# Patient Record
Sex: Female | Born: 1950 | Race: White | Hispanic: No | Smoking: Former smoker
Health system: Southern US, Community
[De-identification: ages and names within clinical notes are randomized; demographics above are authoritative.]

## PROBLEM LIST (undated history)

## (undated) DIAGNOSIS — F419 Anxiety disorder, unspecified: Secondary | ICD-10-CM

## (undated) DIAGNOSIS — F32A Depression, unspecified: Secondary | ICD-10-CM

## (undated) DIAGNOSIS — H9192 Unspecified hearing loss, left ear: Secondary | ICD-10-CM

## (undated) DIAGNOSIS — I10 Essential (primary) hypertension: Secondary | ICD-10-CM

## (undated) DIAGNOSIS — J310 Chronic rhinitis: Secondary | ICD-10-CM

## (undated) DIAGNOSIS — R7303 Prediabetes: Secondary | ICD-10-CM

## (undated) DIAGNOSIS — E785 Hyperlipidemia, unspecified: Secondary | ICD-10-CM

## (undated) DIAGNOSIS — E7800 Pure hypercholesterolemia, unspecified: Secondary | ICD-10-CM

## (undated) HISTORY — PX: BREAST EXCISIONAL BIOPSY: SUR124

## (undated) HISTORY — PX: COLONOSCOPY: SHX174

---

## 1994-10-09 HISTORY — PX: BREAST BIOPSY: SHX20

## 1997-10-09 HISTORY — PX: BREAST BIOPSY: SHX20

## 1999-10-10 HISTORY — PX: BREAST CYST ASPIRATION: SHX578

## 2005-03-21 ENCOUNTER — Ambulatory Visit: Payer: Self-pay | Admitting: Internal Medicine

## 2006-04-17 ENCOUNTER — Ambulatory Visit: Payer: Self-pay | Admitting: Internal Medicine

## 2007-04-22 ENCOUNTER — Ambulatory Visit: Payer: Self-pay | Admitting: Internal Medicine

## 2008-04-22 ENCOUNTER — Ambulatory Visit: Payer: Self-pay | Admitting: Internal Medicine

## 2008-07-06 ENCOUNTER — Ambulatory Visit: Payer: Self-pay | Admitting: Unknown Physician Specialty

## 2009-04-23 ENCOUNTER — Ambulatory Visit: Payer: Self-pay | Admitting: Internal Medicine

## 2010-05-05 ENCOUNTER — Ambulatory Visit: Payer: Self-pay | Admitting: Internal Medicine

## 2011-05-08 ENCOUNTER — Ambulatory Visit: Payer: Self-pay | Admitting: Internal Medicine

## 2011-11-10 ENCOUNTER — Ambulatory Visit: Payer: Self-pay | Admitting: Family Medicine

## 2012-05-28 ENCOUNTER — Ambulatory Visit: Payer: Self-pay | Admitting: Family Medicine

## 2013-07-08 ENCOUNTER — Ambulatory Visit: Payer: Self-pay | Admitting: Family Medicine

## 2013-10-15 ENCOUNTER — Ambulatory Visit: Payer: BC Managed Care – PPO | Admitting: Internal Medicine

## 2014-07-31 ENCOUNTER — Ambulatory Visit: Payer: Self-pay | Admitting: Family Medicine

## 2015-07-05 ENCOUNTER — Other Ambulatory Visit: Payer: Self-pay | Admitting: Family Medicine

## 2015-07-05 DIAGNOSIS — Z1231 Encounter for screening mammogram for malignant neoplasm of breast: Secondary | ICD-10-CM

## 2015-08-02 ENCOUNTER — Ambulatory Visit
Admission: RE | Admit: 2015-08-02 | Discharge: 2015-08-02 | Disposition: A | Discharge status: Home / Self Care | Payer: BLUE CROSS/BLUE SHIELD | Source: Ambulatory Visit | Attending: Family Medicine | Admitting: Family Medicine

## 2015-08-02 DIAGNOSIS — Z1231 Encounter for screening mammogram for malignant neoplasm of breast: Secondary | ICD-10-CM | POA: Insufficient documentation

## 2016-07-13 ENCOUNTER — Other Ambulatory Visit: Payer: Self-pay | Admitting: Family Medicine

## 2016-07-13 DIAGNOSIS — Z1231 Encounter for screening mammogram for malignant neoplasm of breast: Secondary | ICD-10-CM

## 2016-08-09 ENCOUNTER — Ambulatory Visit
Admission: RE | Admit: 2016-08-09 | Discharge: 2016-08-09 | Disposition: A | Discharge status: Home / Self Care | Payer: Managed Care, Other (non HMO) | Source: Ambulatory Visit | Attending: Family Medicine | Admitting: Family Medicine

## 2016-08-09 DIAGNOSIS — Z1231 Encounter for screening mammogram for malignant neoplasm of breast: Secondary | ICD-10-CM | POA: Diagnosis present

## 2016-10-10 IMAGING — MG MM DIGITAL SCREENING BILAT W/ CAD
6 series · 6 of 6 positions shown · non-contrast
Comparison: Previous exam(s).

CLINICAL DATA: Screening.

EXAM:
DIGITAL SCREENING BILATERAL MAMMOGRAM WITH CAD

[L MLO]
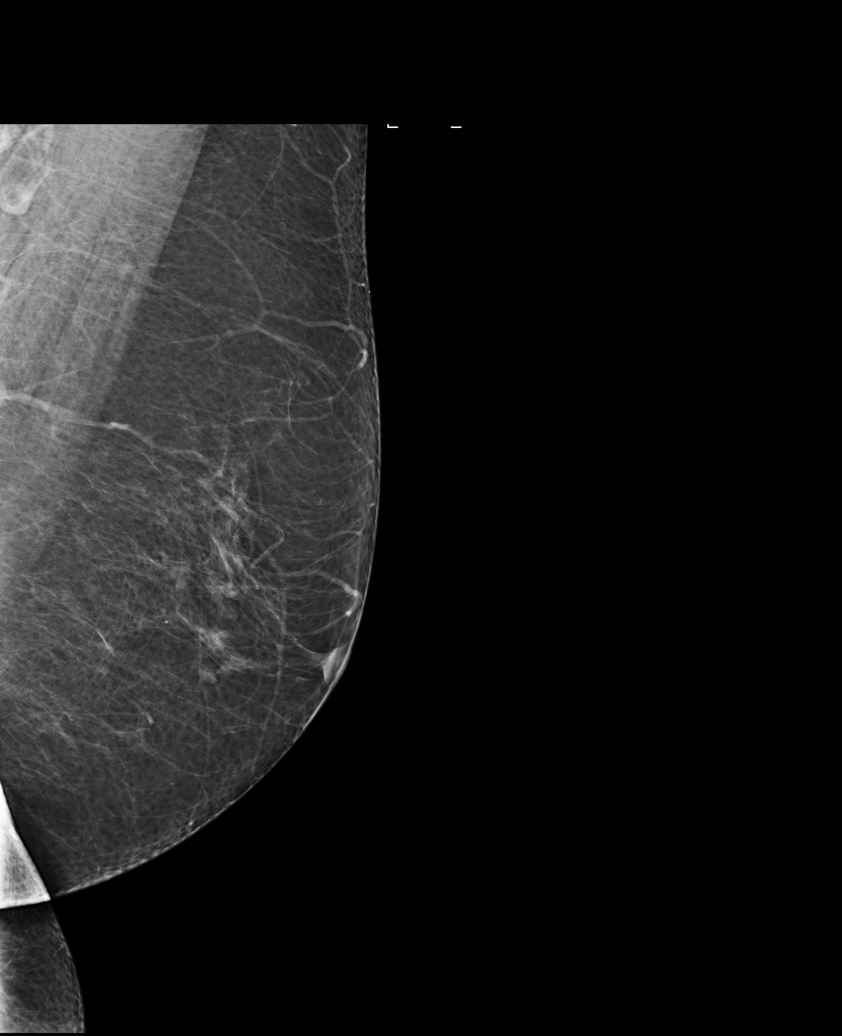

[R CC]
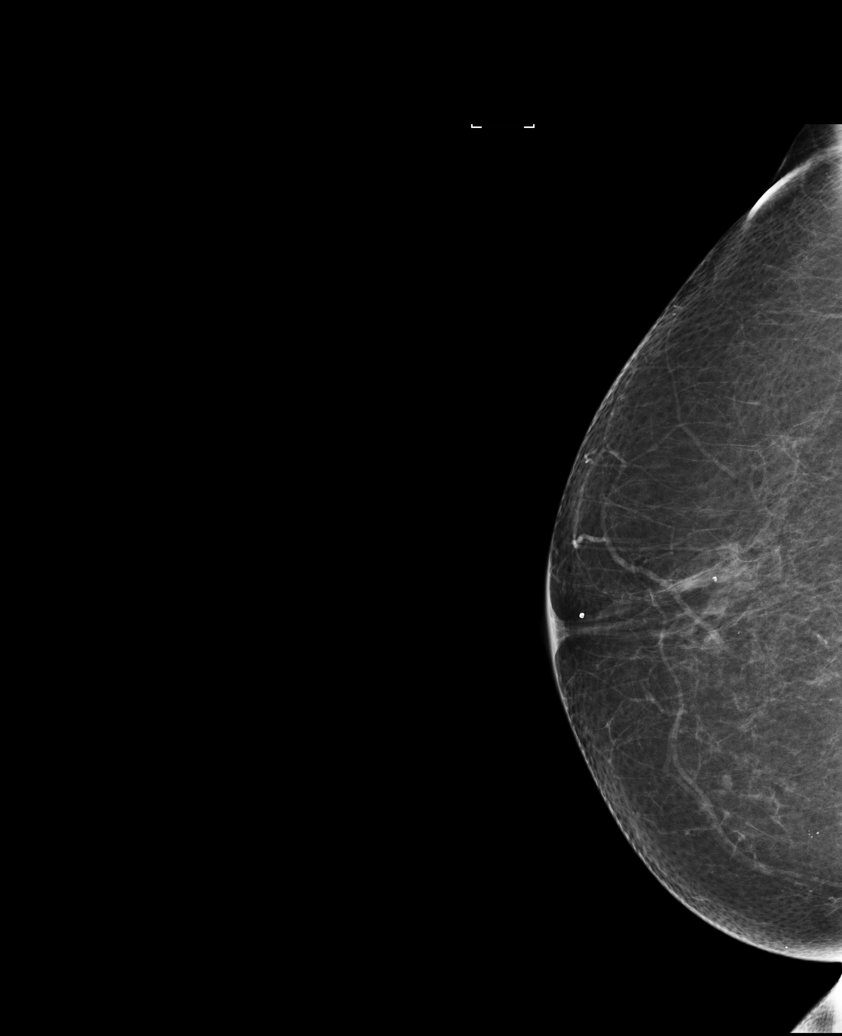

[R MLO (1 of 2)]
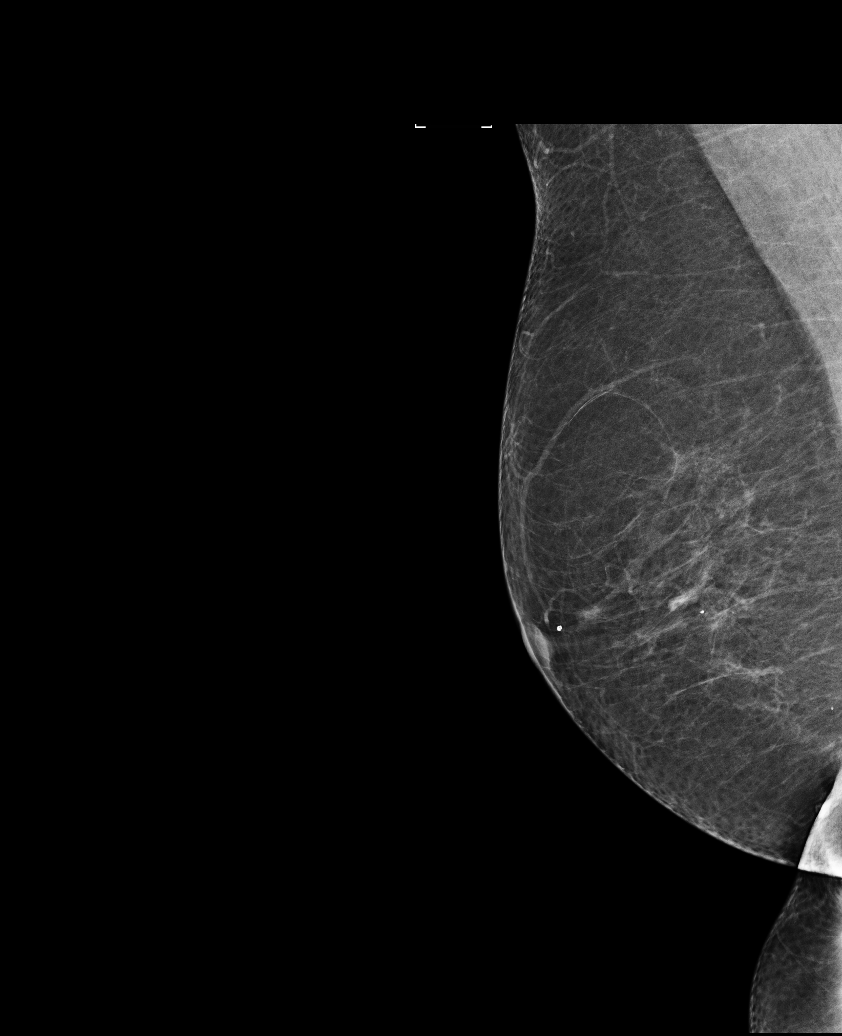

[L CC]
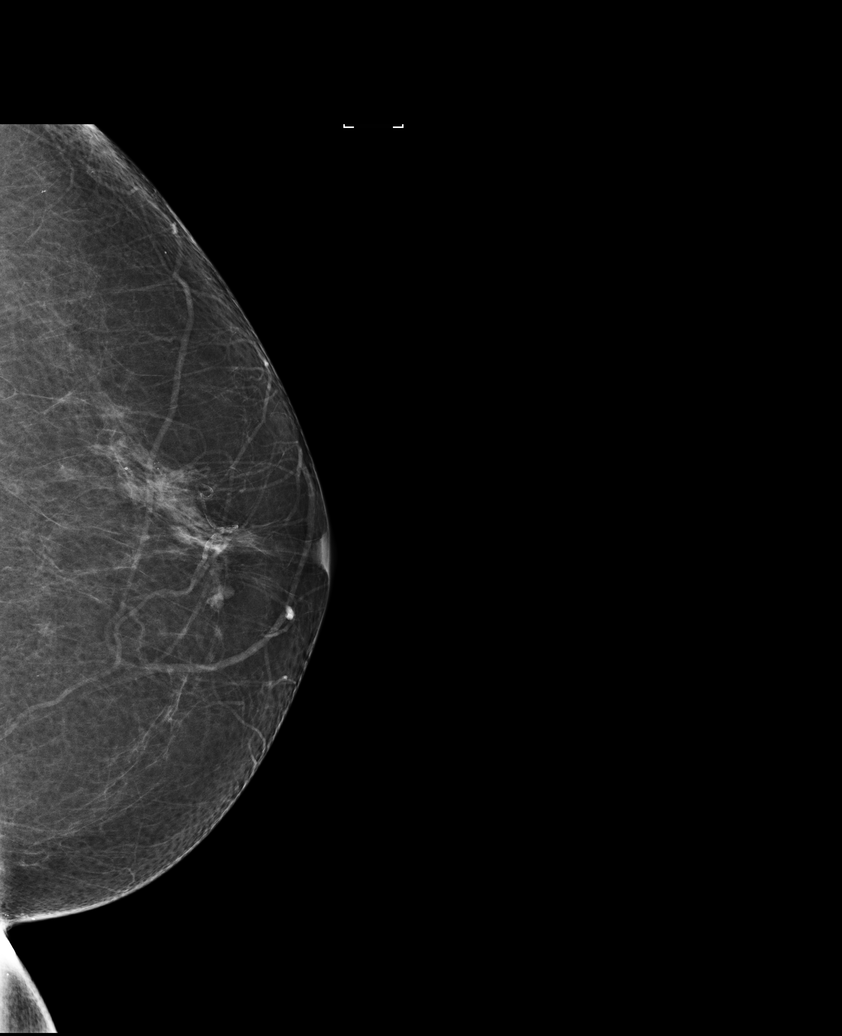

[R MLO (2 of 2)]
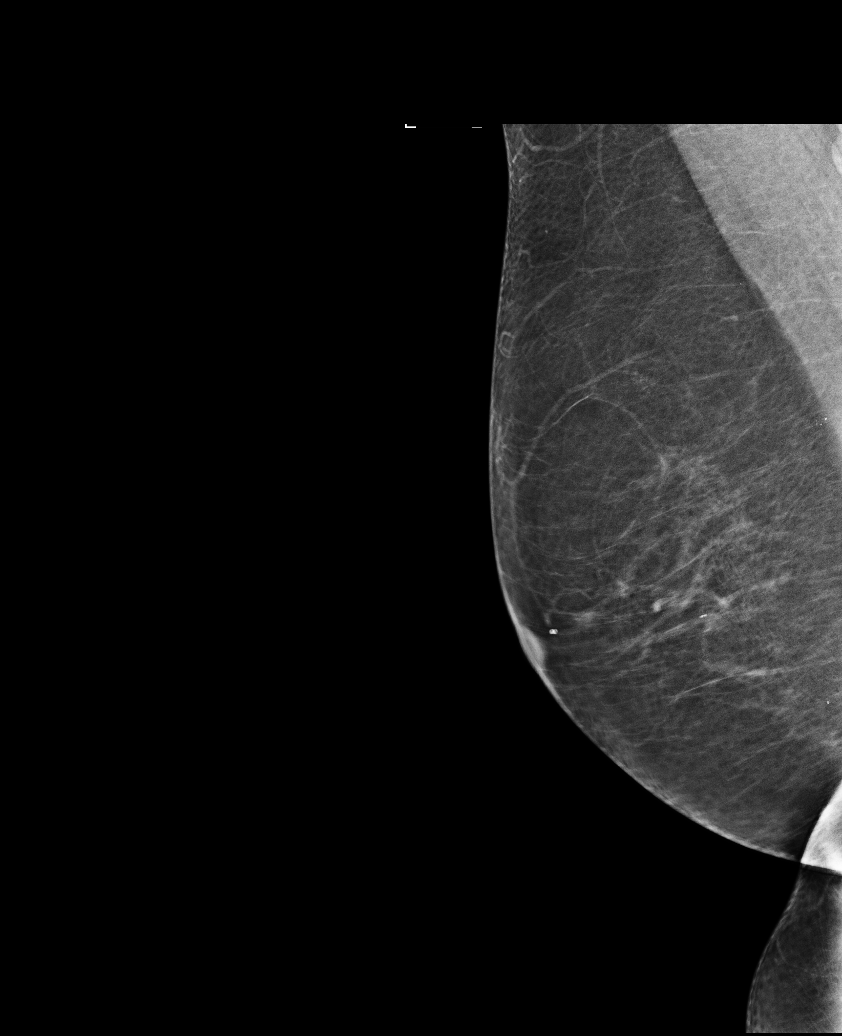

[R XCCM]
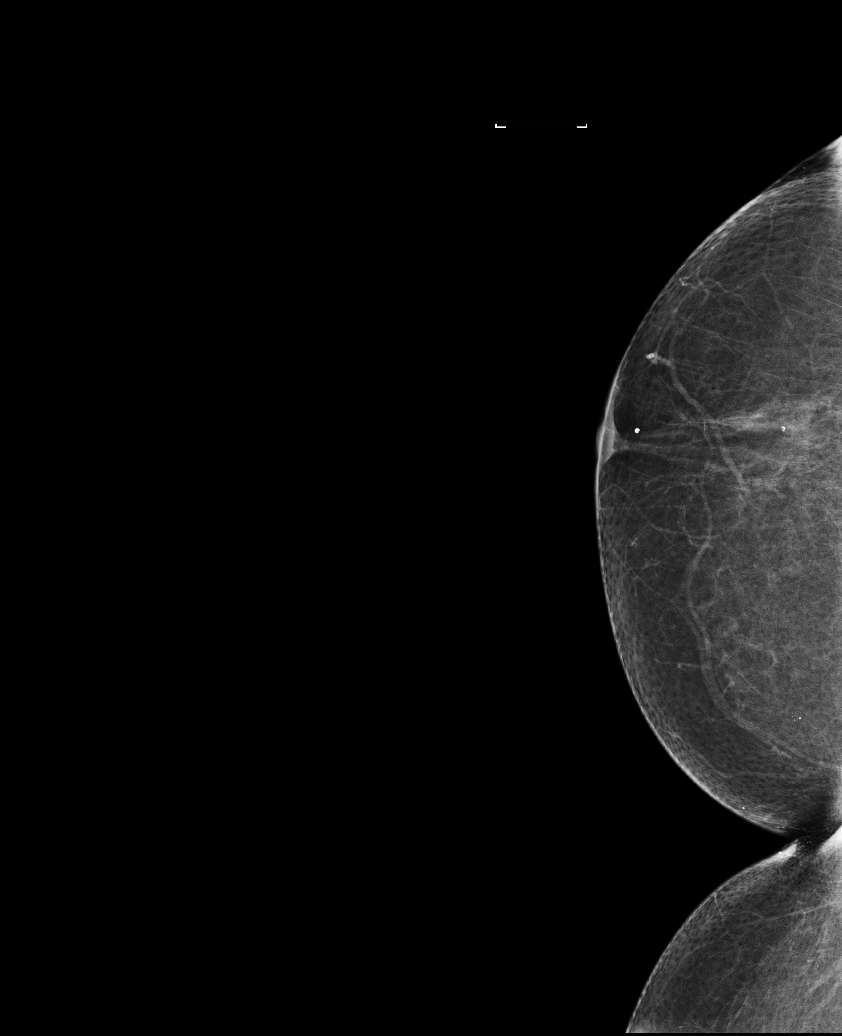

[6 of 6 positions shown; findings below may reference images not displayed]

ACR Breast Density Category b: There are scattered areas of
fibroglandular density.
FINDINGS: There are no findings suspicious for malignancy. Images were
processed with CAD.
IMPRESSION: No mammographic evidence of malignancy. A result letter of this
screening mammogram will be mailed directly to the patient.

RECOMMENDATION:
Screening mammogram in one year. (Code:AS-G-LCT)

BI-RADS CATEGORY  1: Negative.

## 2017-12-21 ENCOUNTER — Other Ambulatory Visit: Payer: Self-pay | Admitting: Family Medicine

## 2017-12-21 DIAGNOSIS — Z1231 Encounter for screening mammogram for malignant neoplasm of breast: Secondary | ICD-10-CM

## 2018-01-11 ENCOUNTER — Ambulatory Visit
Admission: RE | Admit: 2018-01-11 | Discharge: 2018-01-11 | Disposition: A | Discharge status: Home / Self Care | Payer: Managed Care, Other (non HMO) | Source: Ambulatory Visit | Attending: Family Medicine | Admitting: Family Medicine

## 2018-01-11 DIAGNOSIS — Z1231 Encounter for screening mammogram for malignant neoplasm of breast: Secondary | ICD-10-CM | POA: Insufficient documentation

## 2019-07-31 ENCOUNTER — Other Ambulatory Visit: Payer: Self-pay | Admitting: Family Medicine

## 2019-07-31 DIAGNOSIS — Z1231 Encounter for screening mammogram for malignant neoplasm of breast: Secondary | ICD-10-CM

## 2019-10-21 ENCOUNTER — Ambulatory Visit
Admission: RE | Admit: 2019-10-21 | Discharge: 2019-10-21 | Disposition: A | Discharge status: Home / Self Care | Payer: 59 | Source: Ambulatory Visit | Attending: Family Medicine | Admitting: Family Medicine

## 2019-10-21 DIAGNOSIS — Z1231 Encounter for screening mammogram for malignant neoplasm of breast: Secondary | ICD-10-CM | POA: Diagnosis present

## 2019-10-23 ENCOUNTER — Other Ambulatory Visit: Payer: Self-pay | Admitting: Family Medicine

## 2019-10-23 DIAGNOSIS — R928 Other abnormal and inconclusive findings on diagnostic imaging of breast: Secondary | ICD-10-CM

## 2019-10-23 DIAGNOSIS — N6320 Unspecified lump in the left breast, unspecified quadrant: Secondary | ICD-10-CM

## 2019-10-30 ENCOUNTER — Ambulatory Visit
Admission: RE | Admit: 2019-10-30 | Discharge: 2019-10-30 | Disposition: A | Discharge status: Home / Self Care | Payer: 59 | Source: Ambulatory Visit | Attending: Family Medicine | Admitting: Family Medicine

## 2019-10-30 DIAGNOSIS — R928 Other abnormal and inconclusive findings on diagnostic imaging of breast: Secondary | ICD-10-CM | POA: Diagnosis present

## 2019-10-30 DIAGNOSIS — N6320 Unspecified lump in the left breast, unspecified quadrant: Secondary | ICD-10-CM | POA: Insufficient documentation

## 2021-05-10 ENCOUNTER — Other Ambulatory Visit: Payer: Self-pay | Admitting: Family Medicine

## 2021-05-10 DIAGNOSIS — Z1231 Encounter for screening mammogram for malignant neoplasm of breast: Secondary | ICD-10-CM

## 2021-05-18 ENCOUNTER — Other Ambulatory Visit: Payer: Self-pay

## 2021-05-18 ENCOUNTER — Ambulatory Visit
Admission: RE | Admit: 2021-05-18 | Discharge: 2021-05-18 | Disposition: A | Discharge status: Home / Self Care | Payer: 59 | Source: Ambulatory Visit | Attending: Family Medicine | Admitting: Family Medicine

## 2021-05-18 DIAGNOSIS — Z1231 Encounter for screening mammogram for malignant neoplasm of breast: Secondary | ICD-10-CM | POA: Insufficient documentation

## 2021-05-25 ENCOUNTER — Other Ambulatory Visit: Payer: Self-pay | Admitting: Family Medicine

## 2021-05-25 DIAGNOSIS — N6320 Unspecified lump in the left breast, unspecified quadrant: Secondary | ICD-10-CM

## 2021-05-25 DIAGNOSIS — N6489 Other specified disorders of breast: Secondary | ICD-10-CM

## 2021-05-25 DIAGNOSIS — R928 Other abnormal and inconclusive findings on diagnostic imaging of breast: Secondary | ICD-10-CM

## 2021-06-01 ENCOUNTER — Ambulatory Visit
Admission: RE | Admit: 2021-06-01 | Discharge: 2021-06-01 | Disposition: A | Discharge status: Home / Self Care | Payer: 59 | Source: Ambulatory Visit | Attending: Family Medicine | Admitting: Family Medicine

## 2021-06-01 ENCOUNTER — Other Ambulatory Visit: Payer: Self-pay

## 2021-06-01 DIAGNOSIS — R928 Other abnormal and inconclusive findings on diagnostic imaging of breast: Secondary | ICD-10-CM | POA: Diagnosis not present

## 2021-06-01 DIAGNOSIS — N6320 Unspecified lump in the left breast, unspecified quadrant: Secondary | ICD-10-CM

## 2021-06-01 DIAGNOSIS — N6489 Other specified disorders of breast: Secondary | ICD-10-CM

## 2021-10-20 ENCOUNTER — Encounter: Payer: Self-pay | Admitting: Gastroenterology

## 2021-10-20 NOTE — H&P (Signed)
Pre-Procedure H&P   Patient ID: Cheryl Wilkinson is a 71 y.o. female.  Gastroenterology Provider: Annamaria Helling, DO  Referring Provider: Dr. Netty Starring PCP: Dion Body, MD  Date: 10/21/2021  HPI Ms. Cheryl Wilkinson is a 71 y.o. female who presents today for Colonoscopy for screening colonoscopy.  Patient's last colonoscopy 06/2008 which was noted to have one hyperplastic polyp and IH. Colon was noted to be long and tortuous. No fhx of crc or polyps Daily bm w/o blood melena diarrhea or constipation. Hgb 14, mcv 94 plt 297 cr 0.8 No other acute gi complaints  Past Medical History:  Diagnosis Date   Anxiety    Borderline diabetes mellitus    Chronic rhinitis    Depression    Hearing loss in left ear    Hypercholesteremia    Hyperlipemia    Hypertension    Morbid obesity ([place])     Past Surgical History:  Procedure Laterality Date   BREAST BIOPSY Left 1999   neg   BREAST CYST ASPIRATION Right 2001   cyst aspiration, benign   BREAST EXCISIONAL BIOPSY Left    1990's   COLONOSCOPY      Family History No h/o GI disease or malignancy  Review of Systems  Constitutional:  Negative for activity change, appetite change, chills, diaphoresis, fatigue, fever and unexpected weight change.  HENT:  Negative for trouble swallowing and voice change.   Respiratory:  Negative for shortness of breath and wheezing.   Cardiovascular:  Negative for chest pain, palpitations and leg swelling.  Gastrointestinal:  Negative for abdominal distention, abdominal pain, anal bleeding, blood in stool, constipation, diarrhea, nausea, rectal pain and vomiting.  Musculoskeletal:  Negative for arthralgias and myalgias.  Skin:  Negative for color change and pallor.  Neurological:  Negative for dizziness, syncope and weakness.  Psychiatric/Behavioral:  Negative for confusion.   All other systems reviewed and are negative.   Medications No current facility-administered medications on  file prior to encounter.   Current Outpatient Medications on File Prior to Encounter  Medication Sig Dispense Refill   acetaminophen (TYLENOL) 500 MG tablet Take 500 mg by mouth every 6 (six) hours as needed.     atorvastatin (LIPITOR) 20 MG tablet Take 20 mg by mouth daily.     cetirizine (ZYRTEC) 10 MG tablet Take 10 mg by mouth daily.     fluticasone (FLONASE) 50 MCG/ACT nasal spray Place into both nostrils daily.     losartan (COZAAR) 25 MG tablet Take 25 mg by mouth daily.      Pertinent medications related to GI and procedure were reviewed by me with the patient prior to the procedure   Current Facility-Administered Medications:    0.9 %  sodium chloride infusion, , Intravenous, Continuous, Annamaria Helling, DO      Not on File Allergies were reviewed by me prior to the procedure  Objective    Vitals:   10/21/21 0838  BP: (!) 167/99  Pulse: 82  Resp: 16  Temp: (!) 97.1 F (36.2 C)  TempSrc: Temporal  SpO2: 100%  Weight: 103.9 kg  Height: 5\' 6"  (1.676 m)     Physical Exam Vitals and nursing note reviewed.  Constitutional:      General: She is not in acute distress.    Appearance: Normal appearance. She is obese. She is not ill-appearing, toxic-appearing or diaphoretic.  HENT:     Head: Normocephalic and atraumatic.     Nose: Nose normal.  Mouth/Throat:     Mouth: Mucous membranes are moist.     Pharynx: Oropharynx is clear.  Eyes:     General: No scleral icterus.    Extraocular Movements: Extraocular movements intact.  Cardiovascular:     Rate and Rhythm: Normal rate.     Heart sounds: Normal heart sounds. No murmur heard.   No friction rub. No gallop.  Pulmonary:     Effort: Pulmonary effort is normal. No respiratory distress.     Breath sounds: Normal breath sounds. No wheezing, rhonchi or rales.  Abdominal:     General: Bowel sounds are normal. There is no distension.     Palpations: Abdomen is soft.     Tenderness: There is no abdominal  tenderness. There is no guarding or rebound.  Musculoskeletal:     Cervical back: Neck supple.     Right lower leg: No edema.     Left lower leg: No edema.  Skin:    General: Skin is warm and dry.     Coloration: Skin is not jaundiced or pale.  Neurological:     General: No focal deficit present.     Mental Status: She is alert and oriented to person, place, and time. Mental status is at baseline.  Psychiatric:        Mood and Affect: Mood normal.        Behavior: Behavior normal.        Thought Content: Thought content normal.        Judgment: Judgment normal.     Assessment:  Ms. Cheryl Wilkinson is a 71 y.o. female  who presents today for Colonoscopy for screening colonoscopy.  Plan:  Colonoscopy with possible intervention today  Colonoscopy with possible biopsy, control of bleeding, polypectomy, and interventions as necessary has been discussed with the patient/patient representative. Informed consent was obtained from the patient/patient representative after explaining the indication, nature, and risks of the procedure including but not limited to death, bleeding, perforation, missed neoplasm/lesions, cardiorespiratory compromise, and reaction to medications. Opportunity for questions was given and appropriate answers were provided. Patient/patient representative has verbalized understanding is amenable to undergoing the procedure.   Annamaria Helling, DO  [facility]  Portions of the record may have been created with voice recognition software. Occasional wrong-word or 'sound-a-like' substitutions may have occurred due to the inherent limitations of voice recognition software.  Read the chart carefully and recognize, using context, where substitutions may have occurred.

## 2021-10-21 ENCOUNTER — Other Ambulatory Visit: Payer: Self-pay

## 2021-10-21 ENCOUNTER — Ambulatory Visit: Payer: BLUE CROSS/BLUE SHIELD | Admitting: Anesthesiology

## 2021-10-21 ENCOUNTER — Encounter: Payer: Self-pay | Admitting: Gastroenterology

## 2021-10-21 ENCOUNTER — Encounter
Admission: RE | Disposition: A | Discharge status: Home / Self Care | Payer: Self-pay | Source: Home / Self Care | Attending: Gastroenterology

## 2021-10-21 ENCOUNTER — Ambulatory Visit
Admission: RE | Admit: 2021-10-21 | Discharge: 2021-10-21 | Disposition: A | Discharge status: Home / Self Care | Payer: BLUE CROSS/BLUE SHIELD | Attending: Gastroenterology | Admitting: Gastroenterology

## 2021-10-21 DIAGNOSIS — K641 Second degree hemorrhoids: Secondary | ICD-10-CM | POA: Insufficient documentation

## 2021-10-21 DIAGNOSIS — Z1211 Encounter for screening for malignant neoplasm of colon: Secondary | ICD-10-CM | POA: Diagnosis present

## 2021-10-21 DIAGNOSIS — D123 Benign neoplasm of transverse colon: Secondary | ICD-10-CM | POA: Insufficient documentation

## 2021-10-21 HISTORY — DX: Prediabetes: R73.03

## 2021-10-21 HISTORY — DX: Hyperlipidemia, unspecified: E78.5

## 2021-10-21 HISTORY — DX: Pure hypercholesterolemia, unspecified: E78.00

## 2021-10-21 HISTORY — DX: Chronic rhinitis: J31.0

## 2021-10-21 HISTORY — DX: Depression, unspecified: F32.A

## 2021-10-21 HISTORY — PX: COLONOSCOPY WITH PROPOFOL: SHX5780

## 2021-10-21 HISTORY — DX: Unspecified hearing loss, left ear: H91.92

## 2021-10-21 HISTORY — DX: Anxiety disorder, unspecified: F41.9

## 2021-10-21 HISTORY — DX: Morbid (severe) obesity due to excess calories: E66.01

## 2021-10-21 HISTORY — DX: Essential (primary) hypertension: I10

## 2021-10-21 SURGERY — COLONOSCOPY WITH PROPOFOL
Anesthesia: General

## 2021-10-21 MED ORDER — SODIUM CHLORIDE 0.9 % IV SOLN: INTRAVENOUS | Status: DC

## 2021-10-21 MED ORDER — PROPOFOL 10 MG/ML IV BOLUS: INTRAVENOUS | Status: DC | PRN

## 2021-10-21 MED ORDER — PROPOFOL 10 MG/ML IV BOLUS
INTRAVENOUS | Status: AC
  Filled 2021-10-21: qty 40

## 2021-10-21 MED ORDER — LIDOCAINE HCL (CARDIAC) PF 100 MG/5ML IV SOSY
PREFILLED_SYRINGE | INTRAVENOUS | Status: DC | PRN
  Administered 2021-10-21: 40 mg via INTRAVENOUS

## 2021-10-21 MED ORDER — PROPOFOL 10 MG/ML IV BOLUS
INTRAVENOUS | Status: DC | PRN
  Administered 2021-10-21: 30 mg via INTRAVENOUS
  Administered 2021-10-21: 100 mg via INTRAVENOUS
  Administered 2021-10-21: 30 mg via INTRAVENOUS
  Administered 2021-10-21 (×2): 50 mg via INTRAVENOUS
  Administered 2021-10-21 (×2): 40 mg via INTRAVENOUS
  Administered 2021-10-21: 50 mg via INTRAVENOUS
  Administered 2021-10-21: 20 mg via INTRAVENOUS
  Administered 2021-10-21: 50 mg via INTRAVENOUS
  Administered 2021-10-21: 40 mg via INTRAVENOUS

## 2021-10-21 MED ORDER — PROPOFOL 10 MG/ML IV BOLUS
INTRAVENOUS | Status: AC
  Filled 2021-10-21: qty 20

## 2021-10-21 NOTE — Anesthesia Postprocedure Evaluation (Signed)
Anesthesia Post Note  Patient: Griselda Miner  Procedure(s) Performed: COLONOSCOPY WITH PROPOFOL  Patient location during evaluation: Endoscopy Anesthesia Type: General Level of consciousness: awake and alert Pain management: pain level controlled Vital Signs Assessment: post-procedure vital signs reviewed and stable Respiratory status: spontaneous breathing, nonlabored ventilation, respiratory function stable and patient connected to nasal cannula oxygen Cardiovascular status: blood pressure returned to baseline and stable Postop Assessment: no apparent nausea or vomiting Anesthetic complications: no   No notable events documented.   Last Vitals:  Vitals:   10/21/21 1012 10/21/21 1023  BP: 120/82 (!) 144/82  Pulse: 76 78  Resp: (!) 25 20  Temp:    SpO2: 100% 100%    Last Pain:  Vitals:   10/21/21 0838  TempSrc: Temporal  PainSc: 0-No pain                 Precious Haws Edelmiro Innocent

## 2021-10-21 NOTE — Interval H&P Note (Signed)
History and Physical Interval Note: Preprocedure H&P from 10/21/21  was reviewed and there was no interval change after seeing and examining the patient.  Written consent was obtained from the patient after discussion of risks, benefits, and alternatives. Patient has consented to proceed with Colonoscopy with possible intervention   10/21/2021 9:02 AM  Cheryl Wilkinson  has presented today for surgery, with the diagnosis of colon cancer screening.  The various methods of treatment have been discussed with the patient and family. After consideration of risks, benefits and other options for treatment, the patient has consented to  Procedure(s): COLONOSCOPY WITH PROPOFOL (N/A) as a surgical intervention.  The patient's history has been reviewed, patient examined, no change in status, stable for surgery.  I have reviewed the patient's chart and labs.  Questions were answered to the patient's satisfaction.     Annamaria Helling

## 2021-10-21 NOTE — Op Note (Signed)
[facility] Gastroenterology Patient Name: Cheryl Wilkinson Procedure Date: 10/21/2021 8:55 AM MRN: [id number] Account #: [id number] Date of Birth: 07-14-1951 Admit Type: Outpatient Age: 71 Room: [facility] ENDO ROOM 1 Gender: Female Note Status: Finalized Instrument Name: Colonoscope [id number] Procedure:             Colonoscopy Indications:           Screening for colorectal malignant neoplasm Providers:             Annamaria Helling DO, DO Referring MD:          Dion Body (Referring MD) Medicines:             Monitored Anesthesia Care Complications:         No immediate complications. Estimated blood loss:                         Minimal. Procedure:             Pre-Anesthesia Assessment:                        - Prior to the procedure, a History and Physical was                         performed, and patient medications and allergies were                         reviewed. The patient is competent. The risks and                         benefits of the procedure and the sedation options and                         risks were discussed with the patient. All questions                         were answered and informed consent was obtained.                         Patient identification and proposed procedure were                         verified by the physician, the nurse, the anesthetist                         and the technician in the endoscopy suite. Mental                         Status Examination: alert and oriented. Airway                         Examination: normal oropharyngeal airway and neck                         mobility. Respiratory Examination: clear to                         auscultation. CV Examination: RRR, no murmurs, no S3  or S4. Prophylactic Antibiotics: The patient does not                         require prophylactic antibiotics. Prior                         Anticoagulants: The patient has taken no previous                          anticoagulant or antiplatelet agents. ASA Grade                         Assessment: II - A patient with mild systemic disease.                         After reviewing the risks and benefits, the patient                         was deemed in satisfactory condition to undergo the                         procedure. The anesthesia plan was to use monitored                         anesthesia care (MAC). Immediately prior to                         administration of medications, the patient was                         re-assessed for adequacy to receive sedatives. The                         heart rate, respiratory rate, oxygen saturations,                         blood pressure, adequacy of pulmonary ventilation, and                         response to care were monitored throughout the                         procedure. The physical status of the patient was                         re-assessed after the procedure.                        After obtaining informed consent, the colonoscope was                         passed under direct vision. Throughout the procedure,                         the patient's blood pressure, pulse, and oxygen                         saturations were monitored continuously. The  Colonoscope was introduced through the anus and                         advanced to the the cecum, identified by appendiceal                         orifice and ileocecal valve. The colonoscopy was                         somewhat difficult due to a redundant colon, a                         tortuous colon and the patient's body habitus.                         Successful completion of the procedure was aided by                         changing the patient to a prone position,                         straightening and shortening the scope to obtain bowel                         loop reduction, using scope torsion and lavage. The                          patient tolerated the procedure well. The quality of                         the bowel preparation was evaluated using the BBPS                         [facility] Bowel Preparation Scale) with scores of: Right                         Colon = 3, Transverse Colon = 3 and Left Colon = 3                         (entire mucosa seen well with no residual staining,                         small fragments of stool or opaque liquid). The total                         BBPS score equals 9. The ileocecal valve, appendiceal                         orifice, and rectum were photographed. Findings:      The perianal and digital rectal examinations were normal. Pertinent       negatives include normal sphincter tone.      Non-bleeding internal hemorrhoids were found during retroflexion. The       hemorrhoids were Grade II (internal hemorrhoids that prolapse but reduce       spontaneously). Estimated blood loss: none.      A 3 to 4 mm polyp was found in the  transverse colon. The polyp was       sessile. The polyp was removed with a cold snare. Resection and       retrieval were complete. Estimated blood loss was minimal.      The exam was otherwise without abnormality on direct and retroflexion       views. Impression:            - Non-bleeding internal hemorrhoids.                        - One 3 to 4 mm polyp in the transverse colon, removed                         with a cold snare. Resected and retrieved.                        - The examination was otherwise normal on direct and                         retroflexion views. Recommendation:        - Discharge patient to home.                        - Resume previous diet.                        - Continue present medications.                        - Await pathology results.                        - Repeat colonoscopy for surveillance based on                         pathology results.                        - Return to referring physician as previously                          scheduled. Procedure Code(s):     --- Professional ---                        [phone], Colonoscopy, flexible; with removal of                         tumor(s), polyp(s), or other lesion(s) by snare                         technique Diagnosis Code(s):     --- Professional ---                        Z12.11, Encounter for screening for malignant neoplasm                         of colon                        K63.5, Polyp of colon  K64.1, Second degree hemorrhoids CPT copyright 2019 American [employer]. All rights reserved. The codes documented in this report are preliminary and upon coder review may  be revised to meet current compliance requirements. Attending Participation:      I personally performed the entire procedure. Volney American, DO Annamaria Helling DO, DO 10/21/2021 9:59:38 AM This report has been signed electronically. Number of Addenda: 0 Note Initiated On: 10/21/2021 8:55 AM Scope Withdrawal Time: 0 hours 14 minutes 51 seconds  Total Procedure Duration: 0 hours 27 minutes 25 seconds  Estimated Blood Loss:  Estimated blood loss was minimal.      [facility]

## 2021-10-21 NOTE — Anesthesia Preprocedure Evaluation (Signed)
Anesthesia Evaluation  Patient identified by MRN, date of birth, ID band Patient awake    Reviewed: Allergy & Precautions, NPO status , Patient's Chart, lab work & pertinent test results  History of Anesthesia Complications Negative for: history of anesthetic complications  Airway Mallampati: III  TM Distance: <3 FB Neck ROM: full    Dental  (+) Chipped   Pulmonary neg shortness of breath, former smoker,    Pulmonary exam normal        Cardiovascular Exercise Tolerance: Good hypertension, (-) anginaNormal cardiovascular exam     Neuro/Psych PSYCHIATRIC DISORDERS negative neurological ROS     GI/Hepatic negative GI ROS, Neg liver ROS, neg GERD  ,  Endo/Other  negative endocrine ROS  Renal/GU negative Renal ROS  negative genitourinary   Musculoskeletal   Abdominal   Peds  Hematology negative hematology ROS (+)   Anesthesia Other Findings Past Medical History: No date: Anxiety No date: Borderline diabetes mellitus No date: Chronic rhinitis No date: Depression No date: Hearing loss in left ear No date: Hypercholesteremia No date: Hyperlipemia No date: Hypertension No date: Morbid obesity ([place])  Past Surgical History: 1999: BREAST BIOPSY; Left     Comment:  neg 2001: BREAST CYST ASPIRATION; Right     Comment:  cyst aspiration, benign No date: BREAST EXCISIONAL BIOPSY; Left     Comment:  1990's No date: COLONOSCOPY  BMI    Body Mass Index: 36.96 kg/m      Reproductive/Obstetrics negative OB ROS                             Anesthesia Physical Anesthesia Plan  ASA: 2  Anesthesia Plan: General   Post-op Pain Management:    Induction: Intravenous  PONV Risk Score and Plan: Propofol infusion and TIVA  Airway Management Planned: Natural Airway and Nasal Cannula  Additional Equipment:   Intra-op Plan:   Post-operative Plan:   Informed Consent: I have reviewed the  patients History and Physical, chart, labs and discussed the procedure including the risks, benefits and alternatives for the proposed anesthesia with the patient or authorized representative who has indicated his/her understanding and acceptance.     Dental Advisory Given  Plan Discussed with: Anesthesiologist, CRNA and Surgeon  Anesthesia Plan Comments: (Patient consented for risks of anesthesia including but not limited to:  - adverse reactions to medications - risk of airway placement if required - damage to eyes, teeth, lips or other oral mucosa - nerve damage due to positioning  - sore throat or hoarseness - Damage to heart, brain, nerves, lungs, other parts of body or loss of life  Patient voiced understanding.)        Anesthesia Quick Evaluation

## 2021-10-21 NOTE — Transfer of Care (Signed)
Immediate Anesthesia Transfer of Care Note  Patient: Cheryl Wilkinson  Procedure(s) Performed: COLONOSCOPY WITH PROPOFOL  Patient Location: Endoscopy Unit  Anesthesia Type:General  Level of Consciousness: awake, alert  and oriented  Airway & Oxygen Therapy: Patient Spontanous Breathing  Post-op Assessment: Report given to RN and Post -op Vital signs reviewed and stable  Post vital signs: Reviewed and stable  Last Vitals:  Vitals Value Taken Time  BP 102/61 10/21/21 1001  Temp    Pulse 84 10/21/21 1001  Resp 20 10/21/21 1001  SpO2 100 % 10/21/21 1001  Vitals shown include unvalidated device data.  Last Pain:  Vitals:   10/21/21 0838  TempSrc: Temporal  PainSc: 0-No pain         Complications: No notable events documented.

## 2021-10-24 LAB — SURGICAL PATHOLOGY

## 2022-11-07 DIAGNOSIS — Z6836 Body mass index (BMI) 36.0-36.9, adult: Secondary | ICD-10-CM | POA: Diagnosis not present

## 2022-11-07 DIAGNOSIS — R7303 Prediabetes: Secondary | ICD-10-CM | POA: Diagnosis not present

## 2022-11-07 DIAGNOSIS — I10 Essential (primary) hypertension: Secondary | ICD-10-CM | POA: Diagnosis not present

## 2022-11-07 DIAGNOSIS — E7800 Pure hypercholesterolemia, unspecified: Secondary | ICD-10-CM | POA: Diagnosis not present

## 2022-11-14 DIAGNOSIS — Z0000 Encounter for general adult medical examination without abnormal findings: Secondary | ICD-10-CM | POA: Diagnosis not present

## 2022-11-14 DIAGNOSIS — R7303 Prediabetes: Secondary | ICD-10-CM | POA: Diagnosis not present

## 2022-11-14 DIAGNOSIS — Z6837 Body mass index (BMI) 37.0-37.9, adult: Secondary | ICD-10-CM | POA: Diagnosis not present

## 2022-11-14 DIAGNOSIS — E7800 Pure hypercholesterolemia, unspecified: Secondary | ICD-10-CM | POA: Diagnosis not present

## 2023-05-04 DIAGNOSIS — H5213 Myopia, bilateral: Secondary | ICD-10-CM | POA: Diagnosis not present

## 2023-05-04 DIAGNOSIS — H2513 Age-related nuclear cataract, bilateral: Secondary | ICD-10-CM | POA: Diagnosis not present

## 2023-06-22 DIAGNOSIS — E7800 Pure hypercholesterolemia, unspecified: Secondary | ICD-10-CM | POA: Diagnosis not present

## 2023-06-22 DIAGNOSIS — R7303 Prediabetes: Secondary | ICD-10-CM | POA: Diagnosis not present

## 2023-06-29 DIAGNOSIS — Z6837 Body mass index (BMI) 37.0-37.9, adult: Secondary | ICD-10-CM | POA: Diagnosis not present

## 2023-06-29 DIAGNOSIS — I10 Essential (primary) hypertension: Secondary | ICD-10-CM | POA: Diagnosis not present

## 2023-06-29 DIAGNOSIS — E7800 Pure hypercholesterolemia, unspecified: Secondary | ICD-10-CM | POA: Diagnosis not present

## 2023-06-29 DIAGNOSIS — R7303 Prediabetes: Secondary | ICD-10-CM | POA: Diagnosis not present

## 2023-07-20 ENCOUNTER — Other Ambulatory Visit: Payer: Self-pay | Admitting: Family Medicine

## 2023-07-20 DIAGNOSIS — Z1231 Encounter for screening mammogram for malignant neoplasm of breast: Secondary | ICD-10-CM

## 2023-08-15 ENCOUNTER — Ambulatory Visit
Admission: RE | Admit: 2023-08-15 | Discharge: 2023-08-15 | Disposition: A | Discharge status: Home / Self Care | Payer: PPO | Source: Ambulatory Visit | Attending: Family Medicine | Admitting: Family Medicine

## 2023-08-15 DIAGNOSIS — Z1231 Encounter for screening mammogram for malignant neoplasm of breast: Secondary | ICD-10-CM | POA: Diagnosis not present

## 2023-09-13 ENCOUNTER — Other Ambulatory Visit
Admission: RE | Admit: 2023-09-13 | Discharge: 2023-09-13 | Disposition: A | Discharge status: Home / Self Care | Payer: PPO | Source: Ambulatory Visit | Attending: Emergency Medicine | Admitting: Emergency Medicine

## 2023-09-13 DIAGNOSIS — M25562 Pain in left knee: Secondary | ICD-10-CM | POA: Diagnosis not present

## 2023-09-13 DIAGNOSIS — S8992XA Unspecified injury of left lower leg, initial encounter: Secondary | ICD-10-CM | POA: Diagnosis not present

## 2023-09-13 DIAGNOSIS — M1712 Unilateral primary osteoarthritis, left knee: Secondary | ICD-10-CM | POA: Diagnosis not present

## 2023-09-13 DIAGNOSIS — M25462 Effusion, left knee: Secondary | ICD-10-CM | POA: Diagnosis not present

## 2023-09-13 LAB — D-DIMER, QUANTITATIVE: D-Dimer, Quant: 0.39 ug{FEU}/mL (ref 0.00–0.50)

## 2023-09-19 DIAGNOSIS — J209 Acute bronchitis, unspecified: Secondary | ICD-10-CM | POA: Diagnosis not present

## 2023-09-19 DIAGNOSIS — J984 Other disorders of lung: Secondary | ICD-10-CM | POA: Diagnosis not present

## 2023-09-19 DIAGNOSIS — R058 Other specified cough: Secondary | ICD-10-CM | POA: Diagnosis not present

## 2023-09-19 DIAGNOSIS — R0989 Other specified symptoms and signs involving the circulatory and respiratory systems: Secondary | ICD-10-CM | POA: Diagnosis not present

## 2023-12-21 DIAGNOSIS — I10 Essential (primary) hypertension: Secondary | ICD-10-CM | POA: Diagnosis not present

## 2023-12-21 DIAGNOSIS — E7800 Pure hypercholesterolemia, unspecified: Secondary | ICD-10-CM | POA: Diagnosis not present

## 2023-12-21 DIAGNOSIS — R7303 Prediabetes: Secondary | ICD-10-CM | POA: Diagnosis not present

## 2023-12-28 DIAGNOSIS — M25561 Pain in right knee: Secondary | ICD-10-CM | POA: Diagnosis not present

## 2023-12-28 DIAGNOSIS — E66812 Obesity, class 2: Secondary | ICD-10-CM | POA: Diagnosis not present

## 2023-12-28 DIAGNOSIS — M25562 Pain in left knee: Secondary | ICD-10-CM | POA: Diagnosis not present

## 2023-12-28 DIAGNOSIS — K219 Gastro-esophageal reflux disease without esophagitis: Secondary | ICD-10-CM | POA: Diagnosis not present

## 2023-12-28 DIAGNOSIS — R7303 Prediabetes: Secondary | ICD-10-CM | POA: Diagnosis not present

## 2023-12-28 DIAGNOSIS — Z6838 Body mass index (BMI) 38.0-38.9, adult: Secondary | ICD-10-CM | POA: Diagnosis not present

## 2024-01-14 DIAGNOSIS — Z0000 Encounter for general adult medical examination without abnormal findings: Secondary | ICD-10-CM | POA: Diagnosis not present

## 2024-01-14 DIAGNOSIS — Z6837 Body mass index (BMI) 37.0-37.9, adult: Secondary | ICD-10-CM | POA: Diagnosis not present

## 2024-01-14 DIAGNOSIS — I10 Essential (primary) hypertension: Secondary | ICD-10-CM | POA: Diagnosis not present

## 2024-01-14 DIAGNOSIS — R7303 Prediabetes: Secondary | ICD-10-CM | POA: Diagnosis not present

## 2024-01-14 DIAGNOSIS — E66812 Obesity, class 2: Secondary | ICD-10-CM | POA: Diagnosis not present

## 2024-01-14 DIAGNOSIS — E7800 Pure hypercholesterolemia, unspecified: Secondary | ICD-10-CM | POA: Diagnosis not present

## 2024-01-28 DIAGNOSIS — G8929 Other chronic pain: Secondary | ICD-10-CM | POA: Diagnosis not present

## 2024-01-28 DIAGNOSIS — M25562 Pain in left knee: Secondary | ICD-10-CM | POA: Diagnosis not present

## 2024-08-29 NOTE — Progress Notes (Signed)
 Cheryl Wilkinson                                          MRN: [id number]   08/29/2024   The [facility] Quality Team Specialist reviewed this patient medical record for the purposes of chart review for care gap closure. The following were reviewed: chart review for care gap closure-controlling blood pressure.    [facility] Quality Team

## 2024-09-29 ENCOUNTER — Other Ambulatory Visit: Payer: Self-pay | Admitting: Family Medicine

## 2024-09-29 DIAGNOSIS — Z1231 Encounter for screening mammogram for malignant neoplasm of breast: Secondary | ICD-10-CM

## 2024-10-28 ENCOUNTER — Ambulatory Visit
Admission: RE | Admit: 2024-10-28 | Discharge: 2024-10-28 | Disposition: A | Discharge status: Home / Self Care | Source: Ambulatory Visit | Attending: Family Medicine | Admitting: Family Medicine

## 2024-10-28 DIAGNOSIS — Z1231 Encounter for screening mammogram for malignant neoplasm of breast: Secondary | ICD-10-CM | POA: Diagnosis present
